# Patient Record
Sex: Male | Born: 1975
Health system: Southern US, Community
[De-identification: ages and names within clinical notes are randomized; demographics above are authoritative.]

## PROBLEM LIST (undated history)

## (undated) DIAGNOSIS — K921 Melena: Secondary | ICD-10-CM

## (undated) HISTORY — DX: Melena: K92.1

---

## 2004-01-23 ENCOUNTER — Ambulatory Visit: Payer: Self-pay | Admitting: Internal Medicine

## 2004-01-31 ENCOUNTER — Ambulatory Visit: Payer: Self-pay | Admitting: Internal Medicine

## 2005-04-08 ENCOUNTER — Ambulatory Visit: Payer: Self-pay | Admitting: Internal Medicine

## 2005-07-30 ENCOUNTER — Ambulatory Visit: Payer: Self-pay | Admitting: Internal Medicine

## 2006-05-05 ENCOUNTER — Ambulatory Visit: Payer: Self-pay | Admitting: Internal Medicine

## 2007-03-25 ENCOUNTER — Ambulatory Visit: Payer: Self-pay | Admitting: Internal Medicine

## 2011-04-17 ENCOUNTER — Ambulatory Visit (INDEPENDENT_AMBULATORY_CARE_PROVIDER_SITE_OTHER): Payer: Self-pay | Admitting: Internal Medicine

## 2011-04-17 ENCOUNTER — Encounter: Payer: Self-pay | Admitting: Internal Medicine

## 2011-04-17 VITALS — BP 124/80 | HR 77 | Temp 98.1°F | Ht 71.0 in | Wt 253.0 lb

## 2011-04-17 DIAGNOSIS — Z0000 Encounter for general adult medical examination without abnormal findings: Secondary | ICD-10-CM

## 2011-04-17 NOTE — Patient Instructions (Signed)
[place]  One twice daily  You  may move around, but avoid painful motions and activities.  Gentle stretching exercises 2-3 times daily  Call or return to clinic prn if these symptoms worsen or fail to improve as anticipated.

## 2011-04-17 NOTE — Progress Notes (Signed)
  Subjective:    Patient ID: Phillip Love, male    DOB: 1975-09-01, 36 y.o.   MRN: [id number]  HPI  36 year old patient who is seen today to reestablish with our practice. Approximately 5 weeks ago, the patient was involved in playful wrestling and noted a slight twinge in his right hamstring region following day he became more painful and is in a bit problematic since. Pain is high in the right hamstring and tends to radiate down the right leg to the lateral calf region. No weakness denies any back or hip discomfort. No history of chronic low back pain  Current Allergies:  No known allergies  Past Medical History:  father a 17 status post CABG  mother is 60. Hypertension  A half brother one sister in good health  Social History:  Single works as a Freight forwarder   Review of Systems  Constitutional: Negative for fever, chills, appetite change and fatigue.  HENT: Negative for hearing loss, ear pain, congestion, sore throat, trouble swallowing, neck stiffness, dental problem, voice change and tinnitus.   Eyes: Negative for pain, discharge and visual disturbance.  Respiratory: Negative for cough, chest tightness, wheezing and stridor.   Cardiovascular: Negative for chest pain, palpitations and leg swelling.  Gastrointestinal: Negative for nausea, vomiting, abdominal pain, diarrhea, constipation, blood in stool and abdominal distention.  Genitourinary: Negative for urgency, hematuria, flank pain, discharge, difficulty urinating and genital sores.  Musculoskeletal: Positive for gait problem. Negative for myalgias, back pain, joint swelling and arthralgias.  Skin: Negative for rash.  Neurological: Negative for dizziness, syncope, speech difficulty, weakness, numbness and headaches.  Hematological: Negative for adenopathy. Does not bruise/bleed easily.  Psychiatric/Behavioral: Negative for behavioral problems and dysphoric mood. The patient is not nervous/anxious.        Objective:   Physical Exam  Constitutional: He is oriented to person, place, and time. He appears well-developed.       Overweight. Normal blood pressure  HENT:  Head: Normocephalic.  Right Ear: External ear normal.  Left Ear: External ear normal.  Eyes: Conjunctivae and EOM are normal.  Neck: Normal range of motion.  Cardiovascular: Normal rate and normal heart sounds.   Pulmonary/Chest: Breath sounds normal.  Abdominal: Bowel sounds are normal.  Musculoskeletal: Normal range of motion. He exhibits no edema and no tenderness.       Straight leg test on the right did aggravate discomfort in his posterior upper leg. Range of motion of the hip was intact there is no localized point tenderness No motor weakness. Able to walk on his toes and heels without difficulty Reflexes intact  Neurological: He is alert and oriented to person, place, and time.  Psychiatric: He has a normal mood and affect. His behavior is normal.          Assessment & Plan:   Right upper leg pain. Suspect hamstring strain. We'll treat with anti-inflammatories rest and gentle stretching. We'll call if unimproved Samples of anti-inflammatories dispense

## 2011-05-22 ENCOUNTER — Encounter: Payer: Self-pay | Admitting: Internal Medicine

## 2011-05-22 ENCOUNTER — Ambulatory Visit (INDEPENDENT_AMBULATORY_CARE_PROVIDER_SITE_OTHER): Payer: Self-pay | Admitting: Internal Medicine

## 2011-05-22 VITALS — BP 108/70 | Temp 98.6°F | Wt 255.0 lb

## 2011-05-22 DIAGNOSIS — M79609 Pain in unspecified limb: Secondary | ICD-10-CM

## 2011-05-22 DIAGNOSIS — M79604 Pain in right leg: Secondary | ICD-10-CM

## 2011-05-22 NOTE — Patient Instructions (Signed)
Aleve 2 tablets twice daily  Call next week if unimproved for further diagnostic studies  Wrap  the right upper leg in an  Ace bandage

## 2011-05-22 NOTE — Progress Notes (Signed)
  Subjective:    Love ID: Phillip Love, male    DOB: 11/26/1975, 36 y.o.   MRN: [id number]  HPI  Phillip Love who presents today complaining of right leg pain. He was seen here 5 weeks ago with a five-week history at that time of right leg pain. This apparently began one day after play wrestling mainly in the thigh region. He states the pain has probably worsened and now also notes some discomfort in the anterior thigh and right lateral lower leg. He denies any back pain or leg weakness. At the time of his initial injury there is no obvious swelling or ecchymoses. Denies much in the way of local tenderness. Pain is alleviated by rest and worsened by activity especially standing from a sitting position      Review of Systems  Musculoskeletal: Myalgias: right leg pain.       Objective:   Physical Exam  Musculoskeletal:       Examination right leg revealed no obvious abnormalities there is no swelling ecchymosis or localized tenderness Straight leg examination is unremarkable but more limited hip flexion on the right. Reflexes were blunted but symmetrical Able to walk  on his toes and heels without difficulty          Assessment & Plan:  Right leg pain unclear etiology. This was initially felt to be a hamstring strain. Love is unimproved and perhaps slightly worse over the past 5 weeks clinical exam is fairly unremarkable and nothing to suggest a DVT. We'll treat with Depo-Medrol and also an Ace bandage applied to the right upper leg. If not improved early next week will call the office and consider referral to rheumatology or possibly a lumbar MRI although the Love apparently has no primary health insurance. He'll continue on Aleve 2 twice daily

## 2011-06-11 ENCOUNTER — Telehealth: Payer: Self-pay | Admitting: Internal Medicine

## 2011-06-11 DIAGNOSIS — M79604 Pain in right leg: Secondary | ICD-10-CM

## 2011-06-11 NOTE — Telephone Encounter (Signed)
Pt has come in twice to see Dr Amador Cunas re: upper leg pain. Pain has worsened and pt would like to get a referral to specialist.

## 2011-06-12 NOTE — Telephone Encounter (Signed)
Ortho referral  

## 2011-06-12 NOTE — Telephone Encounter (Signed)
Please advise 

## 2011-06-12 NOTE — Telephone Encounter (Signed)
Spoke with pt- (R) leg pain , pt has no insurance - paying out of pocket

## 2011-06-25 ENCOUNTER — Other Ambulatory Visit: Payer: Self-pay | Admitting: Sports Medicine

## 2011-06-25 ENCOUNTER — Ambulatory Visit
Admission: RE | Admit: 2011-06-25 | Discharge: 2011-06-25 | Disposition: A | Discharge status: Home / Self Care | Payer: Self-pay | Source: Ambulatory Visit | Attending: Sports Medicine | Admitting: Sports Medicine

## 2011-06-25 DIAGNOSIS — M545 Low back pain, unspecified: Secondary | ICD-10-CM

## 2011-08-20 ENCOUNTER — Other Ambulatory Visit: Payer: Self-pay | Admitting: Sports Medicine

## 2011-08-20 DIAGNOSIS — M545 Low back pain, unspecified: Secondary | ICD-10-CM

## 2011-08-30 ENCOUNTER — Ambulatory Visit
Admission: RE | Admit: 2011-08-30 | Discharge: 2011-08-30 | Disposition: A | Discharge status: Home / Self Care | Payer: Self-pay | Source: Ambulatory Visit | Attending: Sports Medicine | Admitting: Sports Medicine

## 2011-08-30 DIAGNOSIS — M545 Low back pain, unspecified: Secondary | ICD-10-CM

## 2011-11-06 ENCOUNTER — Ambulatory Visit: Payer: BC Managed Care – PPO | Attending: Orthopedic Surgery | Admitting: Physical Therapy

## 2011-11-06 DIAGNOSIS — IMO0001 Reserved for inherently not codable concepts without codable children: Secondary | ICD-10-CM | POA: Insufficient documentation

## 2011-11-06 DIAGNOSIS — R5381 Other malaise: Secondary | ICD-10-CM | POA: Insufficient documentation

## 2011-11-06 DIAGNOSIS — M2550 Pain in unspecified joint: Secondary | ICD-10-CM | POA: Insufficient documentation

## 2011-11-06 DIAGNOSIS — M2560 Stiffness of unspecified joint, not elsewhere classified: Secondary | ICD-10-CM | POA: Insufficient documentation

## 2011-11-12 ENCOUNTER — Ambulatory Visit: Payer: BC Managed Care – PPO | Attending: Orthopedic Surgery | Admitting: Physical Therapy

## 2011-11-12 DIAGNOSIS — R5381 Other malaise: Secondary | ICD-10-CM | POA: Insufficient documentation

## 2011-11-12 DIAGNOSIS — M2550 Pain in unspecified joint: Secondary | ICD-10-CM | POA: Insufficient documentation

## 2011-11-12 DIAGNOSIS — IMO0001 Reserved for inherently not codable concepts without codable children: Secondary | ICD-10-CM | POA: Insufficient documentation

## 2011-11-12 DIAGNOSIS — M2560 Stiffness of unspecified joint, not elsewhere classified: Secondary | ICD-10-CM | POA: Insufficient documentation

## 2011-11-14 ENCOUNTER — Ambulatory Visit: Payer: BC Managed Care – PPO | Admitting: Physical Therapy

## 2011-11-17 ENCOUNTER — Ambulatory Visit: Payer: BC Managed Care – PPO | Admitting: Physical Therapy

## 2011-11-20 ENCOUNTER — Ambulatory Visit: Payer: BC Managed Care – PPO | Admitting: Physical Therapy

## 2011-11-20 ENCOUNTER — Encounter: Payer: BC Managed Care – PPO | Admitting: Physical Therapy

## 2011-11-24 ENCOUNTER — Ambulatory Visit: Payer: BC Managed Care – PPO | Admitting: Physical Therapy

## 2011-11-27 ENCOUNTER — Ambulatory Visit: Payer: BC Managed Care – PPO | Admitting: Physical Therapy

## 2012-06-18 ENCOUNTER — Ambulatory Visit: Payer: BC Managed Care – PPO | Admitting: Family Medicine

## 2012-06-18 ENCOUNTER — Encounter: Payer: Self-pay | Admitting: Family Medicine

## 2012-06-18 ENCOUNTER — Ambulatory Visit (INDEPENDENT_AMBULATORY_CARE_PROVIDER_SITE_OTHER): Payer: BC Managed Care – PPO | Admitting: Family Medicine

## 2012-06-18 VITALS — BP 110/70 | Temp 98.5°F

## 2012-06-18 DIAGNOSIS — R05 Cough: Secondary | ICD-10-CM

## 2012-06-18 DIAGNOSIS — R059 Cough, unspecified: Secondary | ICD-10-CM

## 2012-06-18 DIAGNOSIS — R197 Diarrhea, unspecified: Secondary | ICD-10-CM

## 2012-06-18 MED ORDER — BENZONATATE 200 MG PO CAPS
200.0000 mg | ORAL_CAPSULE | Freq: Three times a day (TID) | ORAL | Status: DC | PRN
Start: 2012-06-18 — End: 2012-07-14

## 2012-06-18 NOTE — Patient Instructions (Addendum)
Consider Imodium for diarrhea symptoms and be in touch if no better in 1-2 weeks.

## 2012-06-18 NOTE — Progress Notes (Signed)
  Subjective:    Patient ID: Phillip Love, male    DOB: Dec 30, 1975, 37 y.o.   MRN: [id number]  HPI Acute visit Patient seen with one month history of cough. Cough occurs both day and night. Nonsmoker. No history of asthma. No wheezing. No postnasal drip. No GERD symptoms. Symptoms started as basic cold about one month ago Denies any dyspnea. No pleuritic pain. No hemoptysis. He has not tried anything for cough as far. 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 Watery to loose stools. Generally has 1-2 per day. No recent antibiotics. No abdominal pain. Good appetite. No weight loss. He travels recently to new medications. His symptoms preceded that trip. No associated nausea, vomiting, fever, or chills  Past Medical History  Diagnosis Date  . Blood in stool    No past surgical history on file.  reports that he has never smoked. He has never used smokeless tobacco. He reports that  drinks alcohol. He reports that he does not use illicit drugs. family history includes Alcohol abuse in his other; Arthritis in his other; Heart disease in his other; and Hypertension in his other. No Known Allergies    Review of Systems  Constitutional: Negative for fever, chills, appetite change, fatigue and unexpected weight change.  HENT: Negative for congestion, sore throat, trouble swallowing, postnasal drip and sinus pressure.   Respiratory: Positive for cough. Negative for shortness of breath and wheezing.   Cardiovascular: Negative for chest pain, palpitations and leg swelling.  Gastrointestinal: Positive for diarrhea. Negative for nausea, vomiting, abdominal pain, constipation and blood in stool.  Genitourinary: Negative for dysuria.  Neurological: Negative for dizziness.       Objective:   Physical Exam  Constitutional: He appears well-developed and well-nourished.  HENT:  Right Ear: External  ear normal.  Left Ear: External ear normal.  Mouth/Throat: Oropharynx is clear and moist.  Neck: Neck supple. No thyromegaly present.  Cardiovascular: Normal rate and regular rhythm.   Pulmonary/Chest: Effort normal and breath sounds normal. No respiratory distress. He has no wheezes. He has no rales.  Abdominal: Soft. Bowel sounds are normal. He exhibits no distension. There is no tenderness. There is no rebound.  Lymphadenopathy:    He has no cervical adenopathy.          Assessment & Plan:  #1 cough. Suspect post viral bronchitis. No evidence for GERD, postnasal drip, wheezing, or chronic sinus infection. Try Tessalon Perles 200 mg every 8 hours as needed for cough #2 diarrhea. Symptoms are relatively mild. No red flags such as any fever, bloody stools, or weight loss. Try over-the-counter Imodium. Engage in further workup if not improving by next week

## 2012-07-14 ENCOUNTER — Ambulatory Visit (INDEPENDENT_AMBULATORY_CARE_PROVIDER_SITE_OTHER): Payer: BC Managed Care – PPO | Admitting: Internal Medicine

## 2012-07-14 ENCOUNTER — Encounter: Payer: Self-pay | Admitting: Internal Medicine

## 2012-07-14 VITALS — BP 140/90 | HR 85 | Temp 98.8°F | Resp 20 | Wt 264.0 lb

## 2012-07-14 DIAGNOSIS — J069 Acute upper respiratory infection, unspecified: Secondary | ICD-10-CM

## 2012-07-14 MED ORDER — FLUTICASONE PROPIONATE 50 MCG/ACT NA SUSP: 2.0000 | Freq: Every day | NASAL | Status: DC

## 2012-07-14 NOTE — Progress Notes (Signed)
  Subjective:    Patient ID: Phillip Love, male    DOB: [birth year], 37 y.o.   MRN: [id number]  HPI  37 year old patient who is seen for followup with a chief complaint of cough this has been present for almost 2 months and apparently following a brief  URI. He does note some minimal postnasal drip but no rhinorrhea or sputum production. He was placed on Tessalon 3 weeks ago without benefit.  He does work at [employer] and exposed to tobacco products. No history of asthma or reflux symptoms  He also complains of diarrhea also about 2 months duration. He has 2-3 loose poorly formed stools daily. No dietary changes weight loss abdominal pain or fever  Wt Readings from Last 3 Encounters:  07/14/12 264 lb (119.75 kg)  05/22/11 255 lb (115.667 kg)  04/17/11 253 lb (114.76 kg)      Review of Systems  Constitutional: Negative for fever, chills, appetite change and fatigue.  HENT: Positive for postnasal drip. Negative for hearing loss, ear pain, congestion, sore throat, trouble swallowing, neck stiffness, dental problem, voice change and tinnitus.   Eyes: Negative for pain, discharge and visual disturbance.  Respiratory: Positive for cough. Negative for chest tightness, wheezing and stridor.   Cardiovascular: Negative for chest pain, palpitations and leg swelling.  Gastrointestinal: Positive for diarrhea. Negative for nausea, vomiting, abdominal pain, constipation, blood in stool and abdominal distention.  Genitourinary: Negative for urgency, hematuria, flank pain, discharge, difficulty urinating and genital sores.  Musculoskeletal: Negative for myalgias, back pain, joint swelling, arthralgias and gait problem.  Skin: Negative for rash.  Neurological: Negative for dizziness, syncope, speech difficulty, weakness, numbness and headaches.  Hematological: Negative for adenopathy. Does not bruise/bleed easily.  Psychiatric/Behavioral: Negative for behavioral problems and dysphoric mood. The  patient is not nervous/anxious.        Objective:   Physical Exam  Constitutional: He is oriented to person, place, and time. He appears well-developed.  HENT:  Head: Normocephalic.  Right Ear: External ear normal.  Left Ear: External ear normal.  Eyes: Conjunctivae and EOM are normal.  Neck: Normal range of motion.  Cardiovascular: Normal rate and normal heart sounds.   Pulmonary/Chest: Breath sounds normal.  Abdominal: Bowel sounds are normal.  Musculoskeletal: Normal range of motion. He exhibits no edema and no tenderness.  Neurological: He is alert and oriented to person, place, and time.  Psychiatric: He has a normal mood and affect. His behavior is normal.          Assessment & Plan:   Probable post viral tracheobronchitis aggravated by exposure to cigarette smoke. He does have some mild  postnasal drip.  In view of the chronic cough we'll treat aggressively with a nonsedating antihistamine  and fluticasone nasal spray Diarrhea. Fairly mild but also has been chronic for 2 months we'll give a two-week trial of a probiotic and samples provided

## 2012-07-14 NOTE — Patient Instructions (Signed)
Use a nonsedating antihistamine such as Allegra or Zyrtec once daily  ALIGN  ONE DAILY

## 2013-03-14 IMAGING — CR DG LUMBAR SPINE 2-3V
2 series · 2 of 2 positions shown · non-contrast
Comparison: None.

CLINICAL DATA: Low back pain for 4 months.

LUMBAR SPINE STANDING- 2-3 VIEW

[view not recorded (1 of 2)]
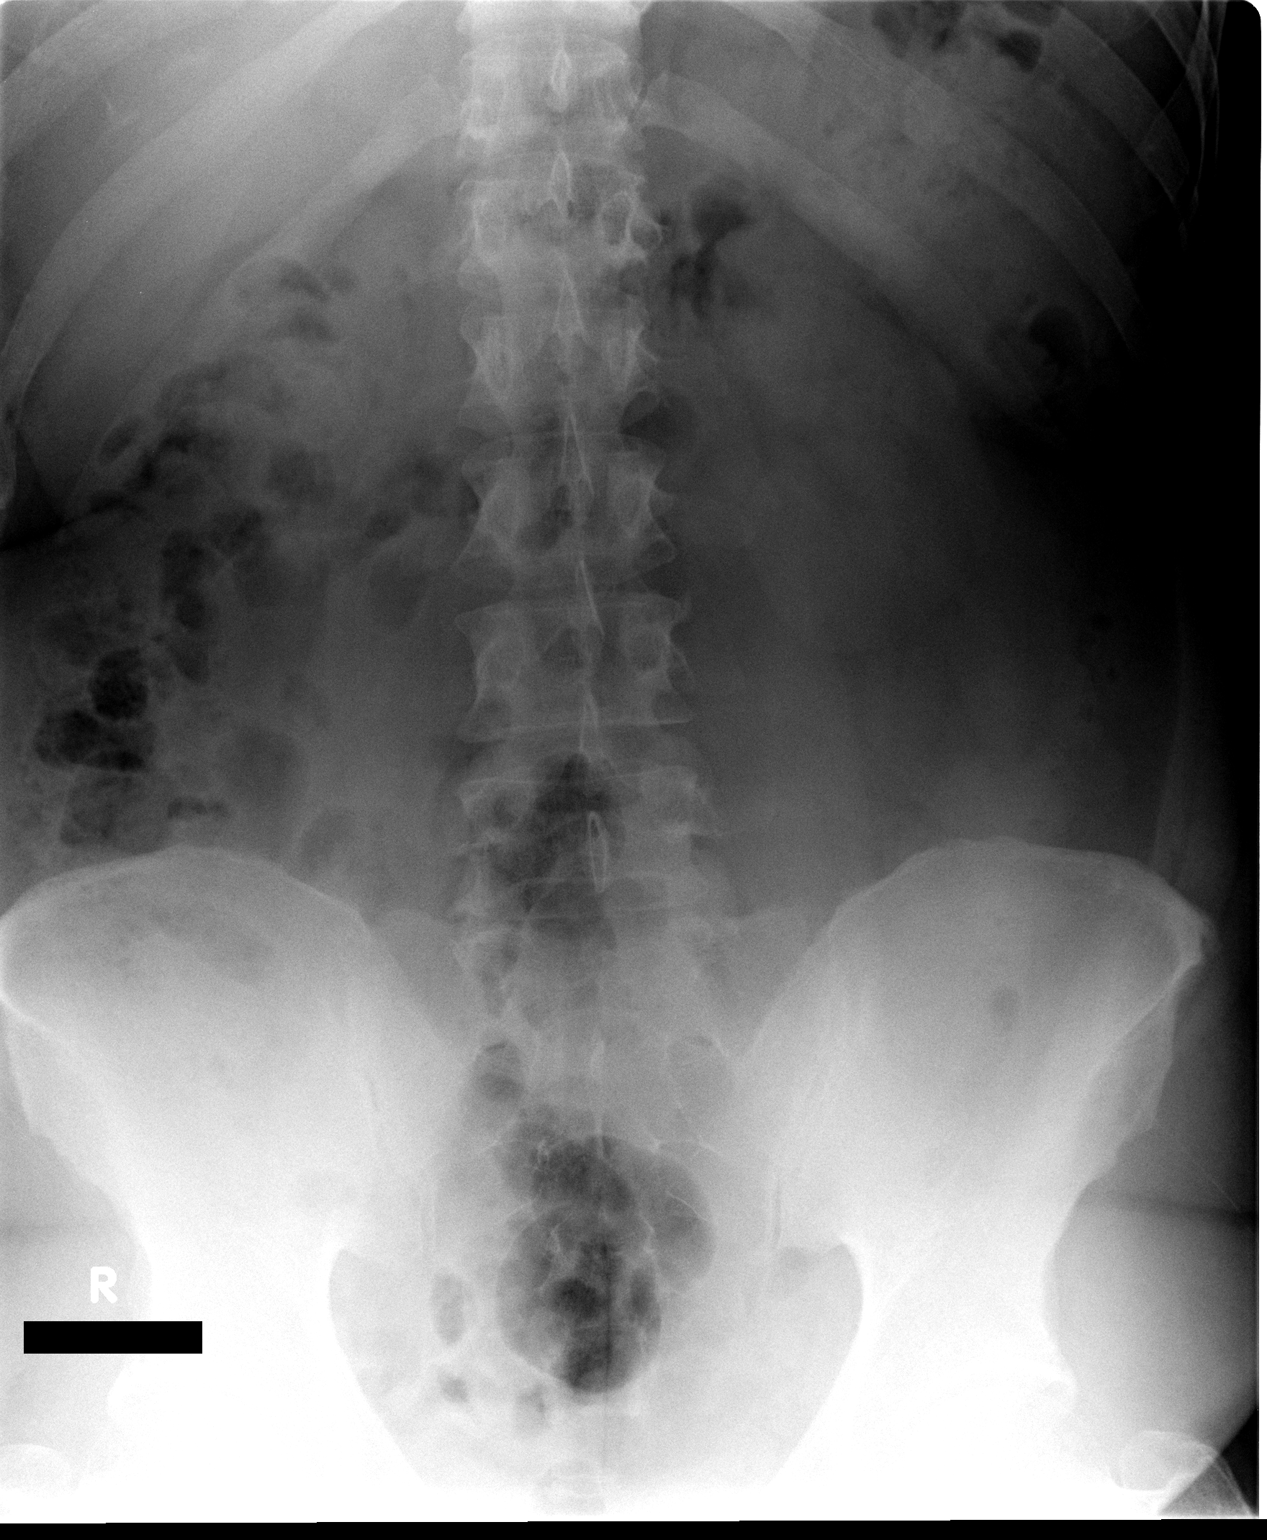

[view not recorded (2 of 2)]
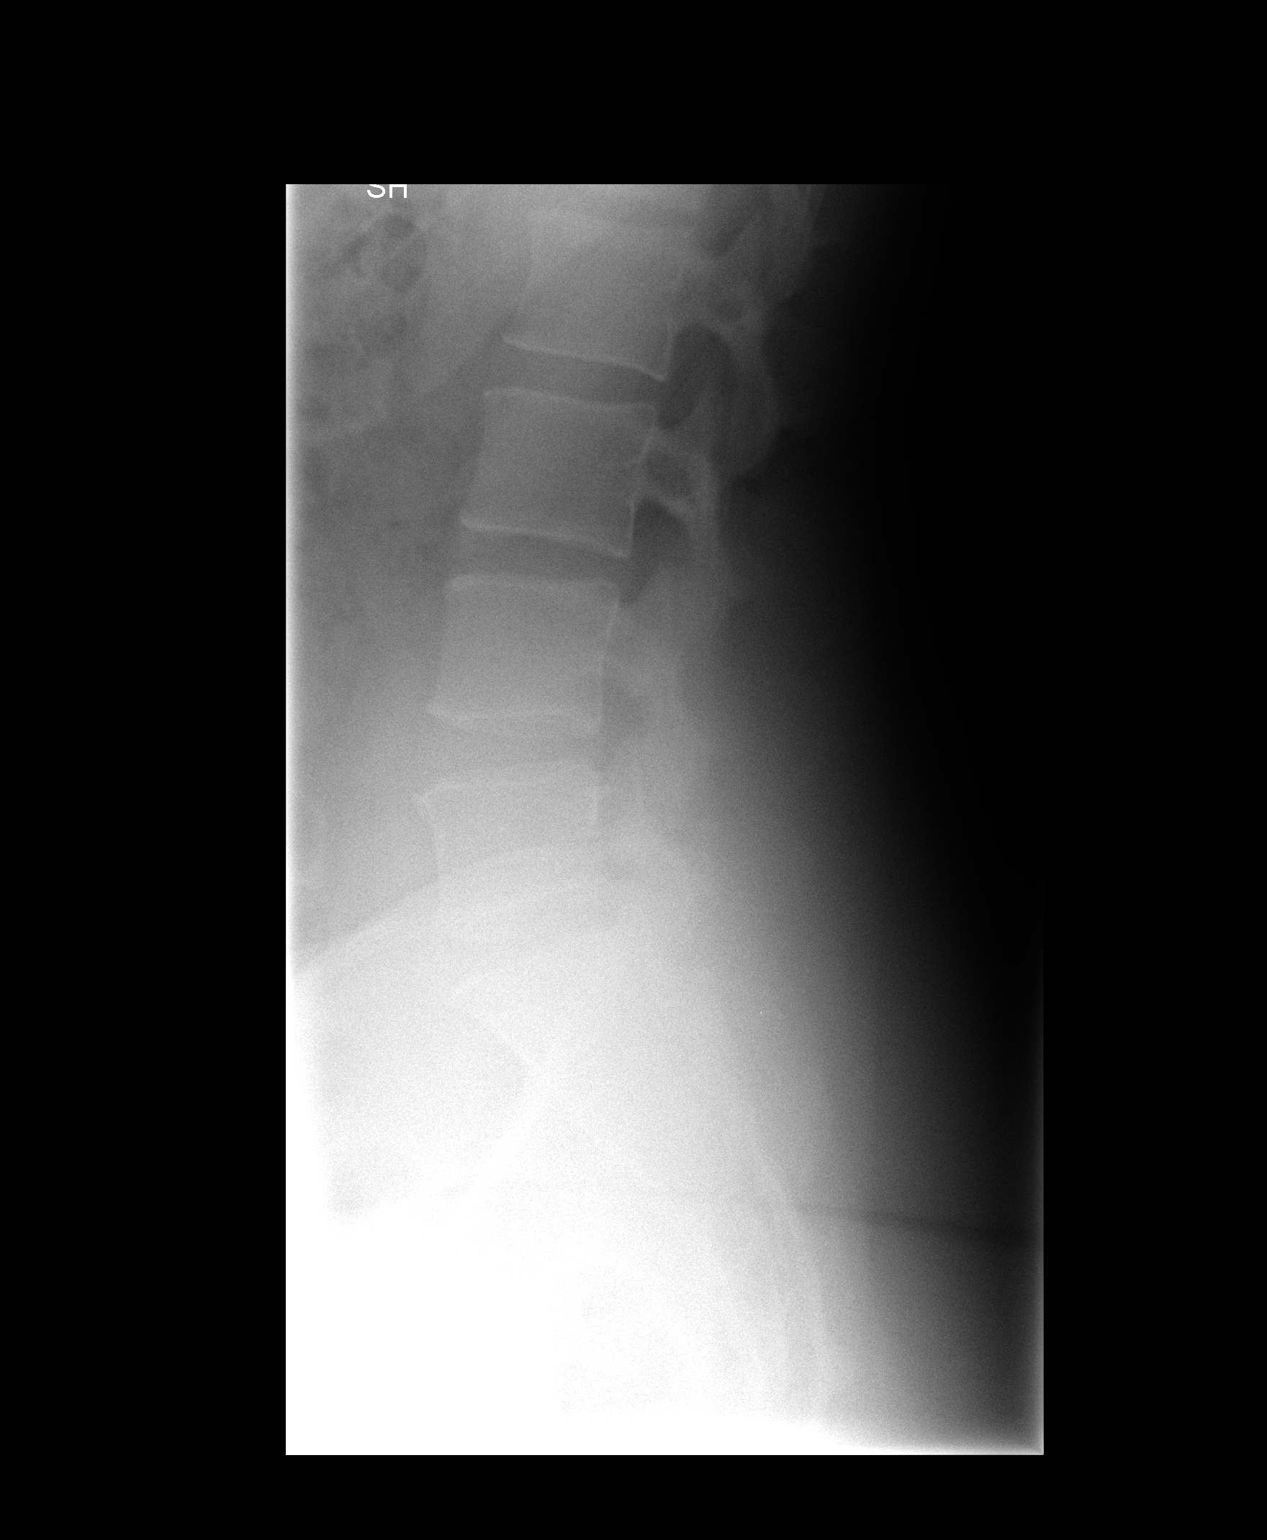

[2 of 2 positions shown; findings below may reference images not displayed]

FINDINGS: There is no evidence of lumbar spine fracture.
Alignment is normal.  Intervertebral disc spaces are maintained.
IMPRESSION: Negative.

## 2015-08-31 ENCOUNTER — Encounter: Payer: Self-pay | Admitting: Internal Medicine

## 2015-08-31 ENCOUNTER — Ambulatory Visit (INDEPENDENT_AMBULATORY_CARE_PROVIDER_SITE_OTHER): Payer: Self-pay | Admitting: Internal Medicine

## 2015-08-31 VITALS — BP 142/90 | HR 82 | Temp 98.4°F | Resp 20 | Ht 71.0 in | Wt 233.0 lb

## 2015-08-31 DIAGNOSIS — B9789 Other viral agents as the cause of diseases classified elsewhere: Principal | ICD-10-CM

## 2015-08-31 DIAGNOSIS — J069 Acute upper respiratory infection, unspecified: Secondary | ICD-10-CM

## 2015-08-31 NOTE — Progress Notes (Signed)
Subjective:    Patient ID: Phillip Love, male    DOB: 1975/06/15, 40 y.o.   MRN: [id number]  HPI  40 year old patient who presents with a two-week history of chest congestion, nasal congestion and nonproductive cough.  The patient has been working 7 days weekly and has not been able to get much rest.  He has been using OTC medications with some benefit.  Denies any fever or significant sputum production Mainly concerned  about refractory symptoms, although they have improved considerably.  This week  Both parents are now deceased.  Father died of complications of cardiac disease. Mother died of liver failure  Past Medical History  Diagnosis Date  . Blood in stool      Social History   Social History  . Marital Status: Single    Spouse Name: N/A  . Number of Children: N/A  . Years of Education: N/A   Occupational History  . Not on file.   Social History Main Topics  . Smoking status: Never Smoker   . Smokeless tobacco: Never Used  . Alcohol Use: Yes  . Drug Use: No  . Sexual Activity: Not on file   Other Topics Concern  . Not on file   Social History Narrative    No past surgical history on file.  Family History  Problem Relation Age of Onset  . Alcohol abuse Other   . Arthritis Other   . Heart disease Other   . Hypertension Other     No Known Allergies  Current Outpatient Prescriptions on File Prior to Visit  Medication Sig Dispense Refill  . acetaminophen (TYLENOL) 500 MG tablet Take 500 mg by mouth every 6 (six) hours as needed.    . fluticasone (FLONASE) 50 MCG/ACT nasal spray Place 2 sprays into the nose daily. (Patient not taking: Reported on 08/31/2015) 16 g 6   No current facility-administered medications on file prior to visit.    BP 142/90 mmHg  Pulse 82  Temp(Src) 98.4 F (36.9 C) (Oral)  Resp 20  Ht 5\' 11"  (1.803 m)  Wt 233 lb (105.688 kg)  BMI 32.51 kg/m2  SpO2 98%     Review of Systems  Constitutional: Positive for  fatigue. Negative for fever, chills and appetite change.  HENT: Positive for congestion and postnasal drip. Negative for dental problem, ear pain, hearing loss, sore throat, tinnitus, trouble swallowing and voice change.   Eyes: Negative for pain, discharge and visual disturbance.  Respiratory: Positive for cough. Negative for chest tightness, wheezing and stridor.   Cardiovascular: Negative for chest pain, palpitations and leg swelling.  Gastrointestinal: Negative for nausea, vomiting, abdominal pain, diarrhea, constipation, blood in stool and abdominal distention.  Genitourinary: Negative for urgency, hematuria, flank pain, discharge, difficulty urinating and genital sores.  Musculoskeletal: Negative for myalgias, back pain, joint swelling, arthralgias, gait problem and neck stiffness.  Skin: Negative for rash.  Neurological: Negative for dizziness, syncope, speech difficulty, weakness, numbness and headaches.  Hematological: Negative for adenopathy. Does not bruise/bleed easily.  Psychiatric/Behavioral: Negative for behavioral problems and dysphoric mood. The patient is not nervous/anxious.        Objective:   Physical Exam  Constitutional: He is oriented to person, place, and time. He appears well-developed.  Blood pressure 130/86  HENT:  Head: Normocephalic.  Right Ear: External ear normal.  Left Ear: External ear normal.  Eyes: Conjunctivae and EOM are normal.  Neck: Normal range of motion.  Cardiovascular: Normal rate and normal heart sounds.  Pulmonary/Chest: Breath sounds normal. No respiratory distress. He has no wheezes. He has no rales.  Abdominal: Bowel sounds are normal.  Musculoskeletal: Normal range of motion. He exhibits no edema or tenderness.  Neurological: He is alert and oriented to person, place, and time.  Psychiatric: He has a normal mood and affect. His behavior is normal.          Assessment & Plan:   Viral URI with cough.  Patient reassured.  Will  attempt to get more rest.  Will continue symptomatic treatment.  No role for antibiotic therapy.  Will report any new or worsening symptoms.  Schedule CPX at his convenience  Rogelia BogaKWIATKOWSKI,Dannae Kato FRANK, MD

## 2015-08-31 NOTE — Progress Notes (Signed)
Pre visit review using our clinic review tool, if applicable. No additional management support is needed unless otherwise documented below in the visit note. 

## 2015-08-31 NOTE — Patient Instructions (Signed)
Acute bronchitis symptoms  are generally not helped by antibiotics.  Take over-the-counter expectorants and cough medications such as  Mucinex DM.  Call if there is no improvement in 5 to 7 days or if  you develop worsening cough, fever, or new symptoms, such as shortness of breath or chest pain.  Call or return to clinic prn if these symptoms worsen or fail to improve as anticipated.  

## 2016-10-31 ENCOUNTER — Encounter: Payer: Self-pay | Admitting: Internal Medicine

## 2016-10-31 ENCOUNTER — Ambulatory Visit (INDEPENDENT_AMBULATORY_CARE_PROVIDER_SITE_OTHER): Payer: BLUE CROSS/BLUE SHIELD | Admitting: Internal Medicine

## 2016-10-31 VITALS — BP 122/72 | HR 85 | Temp 98.2°F | Ht 71.0 in | Wt 243.8 lb

## 2016-10-31 DIAGNOSIS — R040 Epistaxis: Secondary | ICD-10-CM | POA: Diagnosis not present

## 2016-10-31 LAB — CBC WITH DIFFERENTIAL/PLATELET
Basophils Absolute: 0.1 10*3/uL (ref 0.0–0.1)
Basophils Relative: 0.8 % (ref 0.0–3.0)
EOS PCT: 7 % — AB (ref 0.0–5.0)
Eosinophils Absolute: 0.5 10*3/uL (ref 0.0–0.7)
HCT: 45.2 % (ref 39.0–52.0)
Hemoglobin: 15.1 g/dL (ref 13.0–17.0)
Lymphocytes Relative: 24 % (ref 12.0–46.0)
Lymphs Abs: 1.7 10*3/uL (ref 0.7–4.0)
MCHC: 33.5 g/dL (ref 30.0–36.0)
MCV: 88.7 fl (ref 78.0–100.0)
MONO ABS: 0.5 10*3/uL (ref 0.1–1.0)
Monocytes Relative: 7.9 % (ref 3.0–12.0)
Neutro Abs: 4.1 10*3/uL (ref 1.4–7.7)
Neutrophils Relative %: 60.3 % (ref 43.0–77.0)
Platelets: 258 10*3/uL (ref 150.0–400.0)
RBC: 5.1 Mil/uL (ref 4.22–5.81)
RDW: 12.9 % (ref 11.5–15.5)
WBC: 6.9 10*3/uL (ref 4.0–10.5)

## 2016-10-31 NOTE — Patient Instructions (Addendum)
Nosebleed, Adult A nosebleed is when blood comes out of the nose. Nosebleeds are common. Usually, they are not a sign of a serious condition. Nosebleeds can happen if a small blood vessel in your nose starts to bleed or if the lining of your nose (mucous membrane) cracks. They are commonly caused by:  Allergies.  Colds.  Picking your nose.  Blowing your nose too hard.  An injury from sticking an object into your nose or getting hit in the nose.  Dry or cold air.  Less common causes of nosebleeds include:  Toxic fumes.  Something abnormal in the nose or in the air-filled spaces in the bones of the face (sinuses).  Growths in the nose, such as polyps.  Medicines or conditions that cause blood to clot slowly.  Certain illnesses or procedures that irritate or dry out the nasal passages.  Follow these instructions at home: When you have a nosebleed:  Sit down and tilt your head slightly forward.  Use a clean towel or tissue to pinch your nostrils under the bony part of your nose. After 10 minutes, let go of your nose and see if bleeding starts again. Do not release pressure before that time. If there is still bleeding, repeat the pinching and holding for 10 minutes until the bleeding stops.  Do not place tissues or gauze in the nose to stop bleeding.  Avoid lying down and avoid tilting your head backward. That may make blood collect in the throat and cause gagging or coughing.  Use a nasal spray decongestant to help with a nosebleed as told by your health care provider.  Do not use petroleum jelly or mineral oil in your nose. It can drip into your lungs. After a nosebleed:  Avoid blowing your nose or sniffing for a number of hours.  Avoid straining, lifting, or bending at the waist for several days. You may resume other normal activities as you are able.  Use saline spray or a humidifier as told by your health care provider.  Aspirinand blood thinners make bleeding more  likely. If you are prescribed these medicines and you suffer from nosebleeds: ? Ask your health care provider if you should stop taking the medicines or if you should adjust the dose. ? Do not stop taking medicines that your health care provider has recommended unless told by your health care provider.  If your nosebleed was caused by dry mucous membranes, use over-the-counter saline nasal spray or gel. This will keep the mucous membranes moist and allow them to heal. If you must use a lubricant: ? Choose one that is water-soluble. ? Use only as much as you need and use it only as often as needed. ? Do not lie down until several hours after you use it. Contact a health care provider if:  You have a fever.  You get nosebleeds often or more often than usual.  You bruise very easily.  You have a nosebleed from having something stuck in your nose.  You have bleeding in your mouth.  You vomit or cough up brown material.  You have a nosebleed after you start a new medicine. Get help right away if:  You have a nosebleed after a fall or a head injury.  Your nosebleed does not go away after 20 minutes.  You feel dizzy or weak.  You have unusual bleeding from other parts of your body.  You have unusual bruising on other parts of your body.  You become sweaty.    You vomit blood. This information is not intended to replace advice given to you by your health care provider. Make sure you discuss any questions you have with your health care provider. Document Released: 12/04/2004 Document Revised: 10/25/2015 Document Reviewed: 09/11/2015 Elsevier Interactive Patient Education  2018 [employer]  

## 2016-10-31 NOTE — Progress Notes (Signed)
   Subjective:    Patient ID: Phillip Love, male    DOB: [birth year], 41 y.o.   MRN: [id number]  HPI 41 year old patient who is seen today with a chief complaint of right-sided epistaxis.  This occurred 7 days ago and lasted approximately 30 minutes before hemostasis obtained.  He continues to have intermittently scanty bloody mucosa with  nose blowing.  General he is doing quite well. No history of hypertension or hematologic issues.  No other history of abnormal bleeding  Past Medical History:  Diagnosis Date  . Blood in stool      Social History   Social History  . Marital status: Single    Spouse name: N/A  . Number of children: N/A  . Years of education: N/A   Occupational History  . Not on file.   Social History Main Topics  . Smoking status: Never Smoker  . Smokeless tobacco: Never Used  . Alcohol use Yes  . Drug use: No  . Sexual activity: Not on file   Other Topics Concern  . Not on file   Social History Narrative  . No narrative on file    No past surgical history on file.  Family History  Problem Relation Age of Onset  . Alcohol abuse Other   . Arthritis Other   . Heart disease Other   . Hypertension Other     No Known Allergies  Current Outpatient Prescriptions on File Prior to Visit  Medication Sig Dispense Refill  . acetaminophen (TYLENOL) 500 MG tablet Take 500 mg by mouth every 6 (six) hours as needed.     No current facility-administered medications on file prior to visit.     BP 122/72 (BP Location: Left Arm, Patient Position: Sitting, Cuff Size: Normal)   Pulse 85   Temp 98.2 F (36.8 C) (Oral)   Ht 5\' 11"  (1.803 m)   Wt 243 lb 12.8 oz (110.6 kg)   SpO2 97%   BMI 34.00 kg/m      Review of Systems  Constitutional: Negative for appetite change, chills, fatigue and fever.  HENT: Positive for congestion, nosebleeds and rhinorrhea. Negative for dental problem, ear pain, hearing loss, sore throat, tinnitus, trouble swallowing  and voice change.   Eyes: Negative for pain, discharge and visual disturbance.  Respiratory: Negative for cough, chest tightness, wheezing and stridor.   Cardiovascular: Negative for chest pain, palpitations and leg swelling.  Gastrointestinal: Negative for abdominal distention, abdominal pain, blood in stool, constipation, diarrhea, nausea and vomiting.  Genitourinary: Negative for difficulty urinating, discharge, flank pain, genital sores, hematuria and urgency.  Musculoskeletal: Negative for arthralgias, back pain, gait problem, joint swelling, myalgias and neck stiffness.  Skin: Negative for rash.  Neurological: Negative for dizziness, syncope, speech difficulty, weakness, numbness and headaches.  Hematological: Negative for adenopathy. Does not bruise/bleed easily.  Psychiatric/Behavioral: Negative for behavioral problems and dysphoric mood. The patient is not nervous/anxious.        Objective:   Physical Exam  Constitutional: He appears well-developed and well-nourished. No distress.  HENT:  Nose: Nose normal.  Mouth/Throat: Oropharynx is clear and moist.  Nares examined.  No obvious bleeding source could be identified          Assessment & Plan:   Right-sided epistaxis.  Patient information dispensed.  Will observe at this point .  Check CBC  Annual exam at age 75.  Encouraged  Rogelia Boga

## 2017-07-28 ENCOUNTER — Encounter: Payer: BLUE CROSS/BLUE SHIELD | Admitting: Internal Medicine

## 2017-08-18 ENCOUNTER — Ambulatory Visit (INDEPENDENT_AMBULATORY_CARE_PROVIDER_SITE_OTHER): Payer: BLUE CROSS/BLUE SHIELD | Admitting: Internal Medicine

## 2017-08-18 ENCOUNTER — Encounter: Payer: Self-pay | Admitting: Internal Medicine

## 2017-08-18 VITALS — BP 130/76 | HR 86 | Temp 98.3°F | Ht 71.0 in | Wt 238.2 lb

## 2017-08-18 DIAGNOSIS — Z0000 Encounter for general adult medical examination without abnormal findings: Secondary | ICD-10-CM

## 2017-08-18 LAB — COMPREHENSIVE METABOLIC PANEL
ALK PHOS: 66 U/L (ref 39–117)
ALT: 19 U/L (ref 0–53)
AST: 11 U/L (ref 0–37)
Albumin: 4.7 g/dL (ref 3.5–5.2)
BUN: 20 mg/dL (ref 6–23)
CO2: 26 mEq/L (ref 19–32)
Calcium: 10.1 mg/dL (ref 8.4–10.5)
Chloride: 103 mEq/L (ref 96–112)
Creatinine, Ser: 1.15 mg/dL (ref 0.40–1.50)
GFR: 74.81 mL/min (ref >60.00)
GLUCOSE: 101 mg/dL — AB (ref 70–99)
POTASSIUM: 3.9 meq/L (ref 3.5–5.1)
Sodium: 139 mEq/L (ref 135–145)
TOTAL PROTEIN: 6.8 g/dL (ref 6.0–8.3)
Total Bilirubin: 0.5 mg/dL (ref 0.2–1.2)

## 2017-08-18 LAB — LIPID PANEL
Cholesterol: 194 mg/dL (ref 0–200)
HDL: 35.5 mg/dL — ABNORMAL LOW (ref >39.00)
LDL Cholesterol: 130 mg/dL — ABNORMAL HIGH (ref 0–99)
NonHDL: 158.07
Total CHOL/HDL Ratio: 5
Triglycerides: 141 mg/dL (ref 0.0–149.0)
VLDL: 28.2 mg/dL (ref 0.0–40.0)

## 2017-08-18 LAB — CBC WITH DIFFERENTIAL/PLATELET
Basophils Absolute: 0.1 10*3/uL (ref 0.0–0.1)
Basophils Relative: 0.7 % (ref 0.0–3.0)
EOS PCT: 3.2 % (ref 0.0–5.0)
Eosinophils Absolute: 0.3 10*3/uL (ref 0.0–0.7)
HCT: 45.1 % (ref 39.0–52.0)
HEMOGLOBIN: 15.7 g/dL (ref 13.0–17.0)
LYMPHS ABS: 1.7 10*3/uL (ref 0.7–4.0)
Lymphocytes Relative: 20.6 % (ref 12.0–46.0)
MCHC: 34.7 g/dL (ref 30.0–36.0)
MCV: 86.8 fl (ref 78.0–100.0)
MONOS PCT: 7.1 % (ref 3.0–12.0)
Monocytes Absolute: 0.6 10*3/uL (ref 0.1–1.0)
Neutro Abs: 5.6 10*3/uL (ref 1.4–7.7)
Neutrophils Relative %: 68.4 % (ref 43.0–77.0)
Platelets: 277 10*3/uL (ref 150.0–400.0)
RBC: 5.2 Mil/uL (ref 4.22–5.81)
RDW: 12.8 % (ref 11.5–15.5)
WBC: 8.2 10*3/uL (ref 4.0–10.5)

## 2017-08-18 LAB — TSH: TSH: 1.53 u[IU]/mL (ref 0.35–4.50)

## 2017-08-18 MED ORDER — PREDNISONE 10 MG PO TABS: ORAL_TABLET | ORAL | 0 refills | Status: DC

## 2017-08-18 NOTE — Progress Notes (Signed)
Subjective:    Patient ID: Phillip Love, male    DOB: 1976/01/08, 42 y.o.   MRN: [id number]  HPI  42 year old who presents today for a preventive health examination  He enjoys good health without any chronic illnesses and takes no chronic medications. His only complaint today is a 4-week history of a nonpruritic rash involving his trunk abdomen and back regions the spares the face and extremities.  He states that he was using a laundry detergent recently that caused considerable pruritus which he has discontinued  Family history of father died in his mid 5670s of complications of coronary artery disease and senile dementia.  Mother died in her early 160s with complications of diabetes.  She has hypertension One brother and one sister are in good health  Past Medical History:  Diagnosis Date  . Blood in stool      Social History   Socioeconomic History  . Marital status: Single    Spouse name: Not on file  . Number of children: Not on file  . Years of education: Not on file  . Highest education level: Not on file  Occupational History  . Not on file  Social Needs  . Financial resource strain: Not on file  . Food insecurity:    Worry: Not on file    Inability: Not on file  . Transportation needs:    Medical: Not on file    Non-medical: Not on file  Tobacco Use  . Smoking status: Never Smoker  . Smokeless tobacco: Never Used  Substance and Sexual Activity  . Alcohol use: Yes  . Drug use: No  . Sexual activity: Not on file  Lifestyle  . Physical activity:    Days per week: Not on file    Minutes per session: Not on file  . Stress: Not on file  Relationships  . Social connections:    Talks on phone: Not on file    Gets together: Not on file    Attends religious service: Not on file    Active member of club or organization: Not on file    Attends meetings of clubs or organizations: Not on file    Relationship status: Not on file  . Intimate partner violence:      Fear of current or ex partner: Not on file    Emotionally abused: Not on file    Physically abused: Not on file    Forced sexual activity: Not on file  Other Topics Concern  . Not on file  Social History Narrative  . Not on file    No past surgical history on file.  Family History  Problem Relation Age of Onset  . Alcohol abuse Other   . Arthritis Other   . Heart disease Other   . Hypertension Other     No Known Allergies  Current Outpatient Medications on File Prior to Visit  Medication Sig Dispense Refill  . acetaminophen (TYLENOL) 500 MG tablet Take 500 mg by mouth every 6 (six) hours as needed.     No current facility-administered medications on file prior to visit.     BP 130/76 (BP Location: Right Arm, Patient Position: Sitting, Cuff Size: Normal)   Pulse 86   Temp 98.3 F (36.8 C) (Oral)   Ht 5\' 11"  (1.803 m)   Wt 238 lb 3.2 oz (108 kg)   SpO2 98%   BMI 33.22 kg/m     Review of Systems  Constitutional: Negative for appetite change,  chills, fatigue and fever.  HENT: Negative for congestion, dental problem, ear pain, hearing loss, sore throat, tinnitus, trouble swallowing and voice change.   Eyes: Negative for pain, discharge and visual disturbance.  Respiratory: Negative for cough, chest tightness, wheezing and stridor.   Cardiovascular: Negative for chest pain, palpitations and leg swelling.  Gastrointestinal: Negative for abdominal distention, abdominal pain, blood in stool, constipation, diarrhea, nausea and vomiting.  Genitourinary: Negative for difficulty urinating, discharge, flank pain, genital sores, hematuria and urgency.  Musculoskeletal: Negative for arthralgias, back pain, gait problem, joint swelling, myalgias and neck stiffness.  Skin: Positive for rash.  Neurological: Negative for dizziness, syncope, speech difficulty, weakness, numbness and headaches.  Hematological: Negative for adenopathy. Does not bruise/bleed easily.   Psychiatric/Behavioral: Negative for behavioral problems and dysphoric mood. The patient is not nervous/anxious.        Objective:   Physical Exam  Constitutional: He is oriented to person, place, and time. He appears well-developed.  Weight 238  HENT:  Head: Normocephalic.  Right Ear: External ear normal.  Left Ear: External ear normal.  Eyes: Conjunctivae and EOM are normal.  Neck: Normal range of motion.  Cardiovascular: Normal rate and normal heart sounds.  Pulmonary/Chest: Breath sounds normal.  Abdominal: Bowel sounds are normal.  Musculoskeletal: Normal range of motion. He exhibits no edema or tenderness.  Neurological: He is alert and oriented to person, place, and time.  Skin: Rash noted.  Scattered blanching patchy dermatitis with erythematous oval lesions 1 to 2.5 cm in diameter scattered over the abdomen and back area.  A few scattered lesions on the anterior chest.  Rash spares the extremities and facial area  Psychiatric: He has a normal mood and affect. His behavior is normal.          Assessment & Plan:  Preventive health examination.  Will review updated lab Family history of diabetes  Dermatitis lesions appear to be urticarial and were slightly raised but nonpruritic.  Possible contact dermatitis.  Will treat with a prednisone Dosepak and observe  [employer]

## 2017-08-18 NOTE — Patient Instructions (Signed)
DASH Eating Plan DASH stands for "Dietary Approaches to Stop Hypertension." The DASH eating plan is a healthy eating plan that has been shown to reduce high blood pressure (hypertension). It may also reduce your risk for type 2 diabetes, heart disease, and stroke. The DASH eating plan may also help with weight loss. What are tips for following this plan? General guidelines  Avoid eating more than 2,300 mg (milligrams) of salt (sodium) a day. If you have hypertension, you may need to reduce your sodium intake to 1,500 mg a day.  Limit alcohol intake to no more than 1 drink a day for nonpregnant women and 2 drinks a day for men. One drink equals 12 oz of beer, 5 oz of wine, or 1 oz of hard liquor.  Work with your health care provider to maintain a healthy body weight or to lose weight. Ask what an ideal weight is for you.  Get at least 30 minutes of exercise that causes your heart to beat faster (aerobic exercise) most days of the week. Activities may include walking, swimming, or biking.  Work with your health care provider or diet and nutrition specialist (dietitian) to adjust your eating plan to your individual calorie needs. Reading food labels  Check food labels for the amount of sodium per serving. Choose foods with less than 5 percent of the Daily Value of sodium. Generally, foods with less than 300 mg of sodium per serving fit into this eating plan.  To find whole grains, look for the word "whole" as the first word in the ingredient list. Shopping  Buy products labeled as "low-sodium" or "no salt added."  Buy fresh foods. Avoid canned foods and premade or frozen meals. Cooking  Avoid adding salt when cooking. Use salt-free seasonings or herbs instead of table salt or sea salt. Check with your health care provider or pharmacist before using salt substitutes.  Do not fry foods. Cook foods using healthy methods such as baking, boiling, grilling, and broiling instead.  Cook with  heart-healthy oils, such as olive, canola, soybean, or sunflower oil. Meal planning   Eat a balanced diet that includes: ? 5 or more servings of fruits and vegetables each day. At each meal, try to fill half of your plate with fruits and vegetables. ? Up to 6-8 servings of whole grains each day. ? Less than 6 oz of lean meat, poultry, or fish each day. A 3-oz serving of meat is about the same size as a deck of cards. One egg equals 1 oz. ? 2 servings of low-fat dairy each day. ? A serving of nuts, seeds, or beans 5 times each week. ? Heart-healthy fats. Healthy fats called Omega-3 fatty acids are found in foods such as flaxseeds and coldwater fish, like sardines, salmon, and mackerel.  Limit how much you eat of the following: ? Canned or prepackaged foods. ? Food that is high in trans fat, such as fried foods. ? Food that is high in saturated fat, such as fatty meat. ? Sweets, desserts, sugary drinks, and other foods with added sugar. ? Full-fat dairy products.  Do not salt foods before eating.  Try to eat at least 2 vegetarian meals each week.  Eat more home-cooked food and less restaurant, buffet, and fast food.  When eating at a restaurant, ask that your food be prepared with less salt or no salt, if possible. What foods are recommended? The items listed may not be a complete list. Talk with your dietitian about what   dietary choices are best for you. Grains Whole-grain or whole-wheat bread. Whole-grain or whole-wheat pasta. Brown rice. Oatmeal. Quinoa. Bulgur. Whole-grain and low-sodium cereals. Pita bread. Low-fat, low-sodium crackers. Whole-wheat flour tortillas. Vegetables Fresh or frozen vegetables (raw, steamed, roasted, or grilled). Low-sodium or reduced-sodium tomato and vegetable juice. Low-sodium or reduced-sodium tomato sauce and tomato paste. Low-sodium or reduced-sodium canned vegetables. Fruits All fresh, dried, or frozen fruit. Canned fruit in natural juice (without  added sugar). Meat and other protein foods Skinless chicken or turkey. Ground chicken or turkey. Pork with fat trimmed off. Fish and seafood. Egg whites. Dried beans, peas, or lentils. Unsalted nuts, nut butters, and seeds. Unsalted canned beans. Lean cuts of beef with fat trimmed off. Low-sodium, lean deli meat. Dairy Low-fat (1%) or fat-free (skim) milk. Fat-free, low-fat, or reduced-fat cheeses. Nonfat, low-sodium ricotta or cottage cheese. Low-fat or nonfat yogurt. Low-fat, low-sodium cheese. Fats and oils Soft margarine without trans fats. Vegetable oil. Low-fat, reduced-fat, or light mayonnaise and salad dressings (reduced-sodium). Canola, safflower, olive, soybean, and sunflower oils. Avocado. Seasoning and other foods Herbs. Spices. Seasoning mixes without salt. Unsalted popcorn and pretzels. Fat-free sweets. What foods are not recommended? The items listed may not be a complete list. Talk with your dietitian about what dietary choices are best for you. Grains Baked goods made with fat, such as croissants, muffins, or some breads. Dry pasta or rice meal packs. Vegetables Creamed or fried vegetables. Vegetables in a cheese sauce. Regular canned vegetables (not low-sodium or reduced-sodium). Regular canned tomato sauce and paste (not low-sodium or reduced-sodium). Regular tomato and vegetable juice (not low-sodium or reduced-sodium). Pickles. Olives. Fruits Canned fruit in a light or heavy syrup. Fried fruit. Fruit in cream or butter sauce. Meat and other protein foods Fatty cuts of meat. Ribs. Fried meat. Bacon. Sausage. Bologna and other processed lunch meats. Salami. Fatback. Hotdogs. Bratwurst. Salted nuts and seeds. Canned beans with added salt. Canned or smoked fish. Whole eggs or egg yolks. Chicken or turkey with skin. Dairy Whole or 2% milk, cream, and half-and-half. Whole or full-fat cream cheese. Whole-fat or sweetened yogurt. Full-fat cheese. Nondairy creamers. Whipped toppings.  Processed cheese and cheese spreads. Fats and oils Butter. Stick margarine. Lard. Shortening. Ghee. Bacon fat. Tropical oils, such as coconut, palm kernel, or palm oil. Seasoning and other foods Salted popcorn and pretzels. Onion salt, garlic salt, seasoned salt, table salt, and sea salt. Worcestershire sauce. Tartar sauce. Barbecue sauce. Teriyaki sauce. Soy sauce, including reduced-sodium. Steak sauce. Canned and packaged gravies. Fish sauce. Oyster sauce. Cocktail sauce. Horseradish that you find on the shelf. Ketchup. Mustard. Meat flavorings and tenderizers. Bouillon cubes. Hot sauce and Tabasco sauce. Premade or packaged marinades. Premade or packaged taco seasonings. Relishes. Regular salad dressings. Where to find more information:  National Heart, Lung, and Blood Institute: www.nhlbi.nih.gov  American Heart Association: www.heart.org Summary  The DASH eating plan is a healthy eating plan that has been shown to reduce high blood pressure (hypertension). It may also reduce your risk for type 2 diabetes, heart disease, and stroke.  With the DASH eating plan, you should limit salt (sodium) intake to 2,300 mg a day. If you have hypertension, you may need to reduce your sodium intake to 1,500 mg a day.  When on the DASH eating plan, aim to eat more fresh fruits and vegetables, whole grains, lean proteins, low-fat dairy, and heart-healthy fats.  Work with your health care provider or diet and nutrition specialist (dietitian) to adjust your eating plan to your individual   calorie needs. This information is not intended to replace advice given to you by your health care provider. Make sure you discuss any questions you have with your health care provider. Document Released: 02/13/2011 Document Revised: 02/18/2016 Document Reviewed: 02/18/2016 Elsevier Interactive Patient Education  2018 Elsevier Inc.  Health Maintenance, Male A healthy lifestyle and preventive care is important for your  health and wellness. Ask your health care provider about what schedule of regular examinations is right for you. What should I know about weight and diet? Eat a Healthy Diet  Eat plenty of vegetables, fruits, whole grains, low-fat dairy products, and lean protein.  Do not eat a lot of foods high in solid fats, added sugars, or salt.  Maintain a Healthy Weight Regular exercise can help you achieve or maintain a healthy weight. You should:  Do at least 150 minutes of exercise each week. The exercise should increase your heart rate and make you sweat (moderate-intensity exercise).  Do strength-training exercises at least twice a week.  Watch Your Levels of Cholesterol and Blood Lipids  Have your blood tested for lipids and cholesterol every 5 years starting at 40 years of age. If you are at high risk for heart disease, you should start having your blood tested when you are 40 years old. You may need to have your cholesterol levels checked more often if: ? Your lipid or cholesterol levels are high. ? You are older than 40 years of age. ? You are at high risk for heart disease.  What should I know about cancer screening? Many types of cancers can be detected early and may often be prevented. Lung Cancer  You should be screened every year for lung cancer if: ? You are a current smoker who has smoked for at least 30 years. ? You are a former smoker who has quit within the past 15 years.  Talk to your health care provider about your screening options, when you should start screening, and how often you should be screened.  Colorectal Cancer  Routine colorectal cancer screening usually begins at 40 years of age and should be repeated every 5-10 years until you are 40 years old. You may need to be screened more often if early forms of precancerous polyps or small growths are found. Your health care provider may recommend screening at an earlier age if you have risk factors for colon  cancer.  Your health care provider may recommend using home test kits to check for hidden blood in the stool.  A small camera at the end of a tube can be used to examine your colon (sigmoidoscopy or colonoscopy). This checks for the earliest forms of colorectal cancer.  Prostate and Testicular Cancer  Depending on your age and overall health, your health care provider may do certain tests to screen for prostate and testicular cancer.  Talk to your health care provider about any symptoms or concerns you have about testicular or prostate cancer.  Skin Cancer  Check your skin from head to toe regularly.  Tell your health care provider about any new moles or changes in moles, especially if: ? There is a change in a mole's size, shape, or color. ? You have a mole that is larger than a pencil eraser.  Always use sunscreen. Apply sunscreen liberally and repeat throughout the day.  Protect yourself by wearing long sleeves, pants, a wide-brimmed hat, and sunglasses when outside.  What should I know about heart disease, diabetes, and high blood pressure?    If you are 18-39 years of age, have your blood pressure checked every 3-5 years. If you are 40 years of age or older, have your blood pressure checked every year. You should have your blood pressure measured twice-once when you are at a hospital or clinic, and once when you are not at a hospital or clinic. Record the average of the two measurements. To check your blood pressure when you are not at a hospital or clinic, you can use: ? An automated blood pressure machine at a pharmacy. ? A home blood pressure monitor.  Talk to your health care provider about your target blood pressure.  If you are between 45-79 years old, ask your health care provider if you should take aspirin to prevent heart disease.  Have regular diabetes screenings by checking your fasting blood sugar level. ? If you are at a normal weight and have a low risk for  diabetes, have this test once every three years after the age of 45. ? If you are overweight and have a high risk for diabetes, consider being tested at a younger age or more often.  A one-time screening for abdominal aortic aneurysm (AAA) by ultrasound is recommended for men aged 65-75 years who are current or former smokers. What should I know about preventing infection? Hepatitis B If you have a higher risk for hepatitis B, you should be screened for this virus. Talk with your health care provider to find out if you are at risk for hepatitis B infection. Hepatitis C Blood testing is recommended for:  Everyone born from 1945 through 1965.  Anyone with known risk factors for hepatitis C.  Sexually Transmitted Diseases (STDs)  You should be screened each year for STDs including gonorrhea and chlamydia if: ? You are sexually active and are younger than 40 years of age. ? You are older than 40 years of age and your health care provider tells you that you are at risk for this type of infection. ? Your sexual activity has changed since you were last screened and you are at an increased risk for chlamydia or gonorrhea. Ask your health care provider if you are at risk.  Talk with your health care provider about whether you are at high risk of being infected with HIV. Your health care provider may recommend a prescription medicine to help prevent HIV infection.  What else can I do?  Schedule regular health, dental, and eye exams.  Stay current with your vaccines (immunizations).  Do not use any tobacco products, such as cigarettes, chewing tobacco, and e-cigarettes. If you need help quitting, ask your health care provider.  Limit alcohol intake to no more than 2 drinks per day. One drink equals 12 ounces of beer, 5 ounces of wine, or 1 ounces of hard liquor.  Do not use street drugs.  Do not share needles.  Ask your health care provider for help if you need support or information about  quitting drugs.  Tell your health care provider if you often feel depressed.  Tell your health care provider if you have ever been abused or do not feel safe at home. This information is not intended to replace advice given to you by your health care provider. Make sure you discuss any questions you have with your health care provider. Document Released: 08/23/2007 Document Revised: 10/24/2015 Document Reviewed: 11/28/2014 Elsevier Interactive Patient Education  2018 Elsevier Inc.  

## 2018-03-24 ENCOUNTER — Encounter: Payer: Self-pay | Admitting: Internal Medicine

## 2018-03-24 ENCOUNTER — Ambulatory Visit: Payer: BLUE CROSS/BLUE SHIELD | Admitting: Internal Medicine

## 2018-03-24 VITALS — BP 120/80 | HR 88 | Temp 98.5°F | Ht 71.0 in | Wt 240.8 lb

## 2018-03-24 DIAGNOSIS — Z23 Encounter for immunization: Secondary | ICD-10-CM | POA: Diagnosis not present

## 2018-03-24 DIAGNOSIS — J069 Acute upper respiratory infection, unspecified: Secondary | ICD-10-CM

## 2018-03-24 NOTE — Progress Notes (Signed)
Established Patient Office Visit     CC/Reason for Visit: Establish care, follow-up on chronic medical conditions  HPI: Phillip Love is a 43 y.o. male who is coming in today for the above mentioned reasons.  Due for annual physical in June 2020.  He has no past medical history of significance.  He states that for the past week he has had a chest cold with cough productive of yellow sputum, sore throat, chills.  Has been using DayQuil and Mucinex DM without significant relief.  Has not actually taken his temperature.  He otherwise enjoys good health, he does not smoke, he drinks 1 or 2 beers a night, history of Alzheimer's dementia and heart disease in his father.   Past Medical/Surgical History: Past Medical History:  Diagnosis Date  . Blood in stool     No past surgical history on file.  Social History:  reports that he has never smoked. He has never used smokeless tobacco. He reports current alcohol use. He reports that he does not use drugs.  Allergies: No Known Allergies  Family History:  Family History  Problem Relation Age of Onset  . Alcohol abuse Other   . Arthritis Other   . Heart disease Other   . Hypertension Other     No current outpatient medications on file.  Review of Systems:  Constitutional: Denies fever, chills, diaphoresis, appetite change and fatigue.  HEENT: Denies photophobia, eye pain, redness, hearing loss, ear pain,  sneezing, mouth sores, trouble swallowing, neck pain, neck stiffness and tinnitus.   Positive for congestion, sore throat, rhinorrhea, Respiratory: Denies SOB, DOE, cough, chest tightness,  and wheezing.   Cardiovascular: Denies chest pain, palpitations and leg swelling.  Gastrointestinal: Denies nausea, vomiting, abdominal pain, diarrhea, constipation, blood in stool and abdominal distention.  Genitourinary: Denies dysuria, urgency, frequency, hematuria, flank pain and difficulty urinating.  Endocrine: Denies: hot or  cold intolerance, sweats, changes in hair or nails, polyuria, polydipsia. Musculoskeletal: Denies myalgias, back pain, joint swelling, arthralgias and gait problem.  Skin: Denies pallor, rash and wound.  Neurological: Denies dizziness, seizures, syncope, weakness, light-headedness, numbness and headaches.  Hematological: Denies adenopathy. Easy bruising, personal or family bleeding history  Psychiatric/Behavioral: Denies suicidal ideation, mood changes, confusion, nervousness, sleep disturbance and agitation    Physical Exam: Vitals:   03/24/18 1553  BP: 120/80  Pulse: 88  Temp: 98.5 F (36.9 C)  TempSrc: Oral  SpO2: 97%  Weight: 240 lb 12.8 oz (109.2 kg)  Height: 5\' 11"  (1.803 m)    Body mass index is 33.58 kg/m.   Constitutional: NAD, calm, comfortable Eyes: PERRL, lids and conjunctivae normal ENMT: Mucous membranes are moist. Posterior pharynx is erythematous without plaques or exudate. normal dentition.  Neck: normal, supple, no masses, no thyromegaly Respiratory: clear to auscultation bilaterally, no wheezing, no crackles. Normal respiratory effort. No accessory muscle use.  Cardiovascular: Regular rate and rhythm, no murmurs / rubs / gallops. No extremity edema. 2+ pedal pulses. No carotid bruits.  Musculoskeletal: no clubbing / cyanosis. No joint deformity upper and lower extremities. Good ROM, no contractures. Normal muscle tone.  Skin: no rashes, lesions, ulcers. No induration Neurologic: CN 2-12 grossly intact. Sensation intact, DTR normal. Strength 5/5 in all 4.  Psychiatric: Normal judgment and insight. Alert and oriented x 3. Normal mood.    Impression and Plan:  Upper respiratory tract infection, unspecified type -Suspect viral in origin, no need for antibiotics at present. -Advised to use of over-the-counter antihistamine and cold/cough  medication. -Advised to follow-up if no significant improvement within 2 weeks.  Needs flu shot - Plan: Flu Vaccine QUAD  6+ mos PF IM (Fluarix Quad PF)  Need for diphtheria-tetanus-pertussis (Tdap) vaccine - Plan: Tdap vaccine greater than or equal to 7yo IM    Patient Instructions  -It was nice meeting you today!  -Flu and tetanus shots today.  -Schedule follow up after June 2020 for your annual physical. Please come in fasting to that appointment.     Chaya JanEstela Hernandez Acosta, MD [facility]

## 2018-03-24 NOTE — Patient Instructions (Signed)
-  It was nice meeting you today!  -Flu and tetanus shots today.  -Schedule follow up after June 2020 for your annual physical. Please come in fasting to that appointment.

## 2022-03-12 ENCOUNTER — Encounter: Payer: Self-pay | Admitting: Internal Medicine

## 2022-03-12 ENCOUNTER — Ambulatory Visit: Payer: BC Managed Care – PPO | Admitting: Internal Medicine

## 2022-03-12 VITALS — BP 140/80 | HR 97 | Temp 98.4°F | Ht 71.0 in | Wt 258.5 lb

## 2022-03-12 DIAGNOSIS — Z23 Encounter for immunization: Secondary | ICD-10-CM | POA: Diagnosis not present

## 2022-03-12 DIAGNOSIS — J069 Acute upper respiratory infection, unspecified: Secondary | ICD-10-CM

## 2022-03-12 DIAGNOSIS — B309 Viral conjunctivitis, unspecified: Secondary | ICD-10-CM | POA: Diagnosis not present

## 2022-03-12 NOTE — Progress Notes (Signed)
Acute office Visit     CC/Reason for Visit: URI symptoms, red eye  HPI: Phillip Love is a 47 y.o. male who is coming in today for the above mentioned reasons.  He has been having URI symptoms for about a month.  He notes a distinct period of improvement of symptoms followed by worsening.  Yesterday he woke up with an acutely red left eye.  Has had some clear drainage with crustiness.   Past Medical/Surgical History: Past Medical History:  Diagnosis Date   Blood in stool     No past surgical history on file.  Social History:  reports that he has never smoked. He has never used smokeless tobacco. He reports current alcohol use. He reports that he does not use drugs.  Allergies: No Known Allergies  Family History:  Family History  Problem Relation Age of Onset   Alcohol abuse Other    Arthritis Other    Heart disease Other    Hypertension Other     No current outpatient medications on file.  Review of Systems:  Constitutional: Denies fever, chills, diaphoresis, appetite change and fatigue.  HEENT: Denies photophobia, eye pain, mouth sores, trouble swallowing, neck pain, neck stiffness and tinnitus.   Respiratory: Denies SOB, DOE,  chest tightness,  and wheezing.   Cardiovascular: Denies chest pain, palpitations and leg swelling.  Gastrointestinal: Denies nausea, vomiting, abdominal pain, diarrhea, constipation, blood in stool and abdominal distention.  Genitourinary: Denies dysuria, urgency, frequency, hematuria, flank pain and difficulty urinating.  Endocrine: Denies: hot or cold intolerance, sweats, changes in hair or nails, polyuria, polydipsia. Musculoskeletal: Denies myalgias, back pain, joint swelling, arthralgias and gait problem.  Skin: Denies pallor, rash and wound.  Neurological: Denies dizziness, seizures, syncope, weakness, light-headedness, numbness and headaches.  Hematological: Denies adenopathy. Easy bruising, personal or family bleeding  history  Psychiatric/Behavioral: Denies suicidal ideation, mood changes, confusion, nervousness, sleep disturbance and agitation    Physical Exam: Vitals:   03/12/22 1325 03/12/22 1329  BP: (!) 140/90 (!) 140/80  Pulse: 97   Temp: 98.4 F (36.9 C)   TempSrc: Oral   SpO2: 99%   Weight: 258 lb 8 oz (117.3 kg)   Height: 5\' 11"  (1.803 m)     Body mass index is 36.05 kg/m.   Constitutional: NAD, calm, comfortable Eyes: PERRL, significant conjunctival and scleral injection, no purulent drainage ENMT: Mucous membranes are moist. Posterior pharynx is erythematous but clear of any exudate or lesions. Normal dentition. Tympanic membrane is pearly white, no erythema or bulging. Respiratory: clear to auscultation bilaterally, no wheezing, no crackles. Normal respiratory effort. No accessory muscle use.  Cardiovascular: Regular rate and rhythm, no murmurs / rubs / gallops. No extremity edema.   Psychiatric: Normal judgment and insight. Alert and oriented x 3. Normal mood.    Impression and Plan:  Acute viral conjunctivitis of left eye  Needs flu shot - Plan: Flu Vaccine QUAD 6+ mos PF IM (Fluarix Quad PF)  Viral upper respiratory tract infection  -Given exam findings, PNA, pharyngitis, ear infection are not likely, hence abx have not been prescribed. -Have advised rest, fluids, OTC antihistamines, cough suppressants and mucinex. -RTC if no improvement in 10-14 days. -He has left eye conjunctivitis, likely viral given above.  Have advised lubricating eyedrops and return if worsens. -Flu vaccine administered in office today.  Time spent:23 minutes reviewing chart, interviewing and examining patient and formulating plan of care.      Lelon Frohlich, MD [facility]
